# Patient Record
Sex: Male | Born: 2005 | Race: White | Hispanic: No | Marital: Single
Health system: Southern US, Community
[De-identification: ages and names within clinical notes are randomized; demographics above are authoritative.]

---
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
## 2024-07-11 ENCOUNTER — Emergency Department (HOSPITAL_BASED_OUTPATIENT_CLINIC_OR_DEPARTMENT_OTHER): Payer: Self-pay

## 2024-07-11 ENCOUNTER — Other Ambulatory Visit: Payer: Self-pay

## 2024-07-11 ENCOUNTER — Emergency Department (HOSPITAL_BASED_OUTPATIENT_CLINIC_OR_DEPARTMENT_OTHER)
Admission: EM | Admit: 2024-07-11 | Discharge: 2024-07-11 | Disposition: A | Discharge status: Home / Self Care | Payer: Self-pay

## 2024-07-11 ENCOUNTER — Encounter (HOSPITAL_BASED_OUTPATIENT_CLINIC_OR_DEPARTMENT_OTHER): Payer: Self-pay

## 2024-07-11 DIAGNOSIS — X501XXA Overexertion from prolonged static or awkward postures, initial encounter: Secondary | ICD-10-CM | POA: Insufficient documentation

## 2024-07-11 DIAGNOSIS — S4991XA Unspecified injury of right shoulder and upper arm, initial encounter: Secondary | ICD-10-CM | POA: Insufficient documentation

## 2024-07-11 MED ORDER — METHOCARBAMOL 500 MG PO TABS: 500.0000 mg | ORAL_TABLET | Freq: Two times a day (BID) | ORAL | 0 refills | Status: AC

## 2024-07-11 MED ORDER — LIDOCAINE 5 % EX PTCH: 1.0000 | MEDICATED_PATCH | CUTANEOUS | 0 refills | Status: AC

## 2024-07-11 MED ORDER — NAPROXEN 500 MG PO TABS: 500.0000 mg | ORAL_TABLET | Freq: Two times a day (BID) | ORAL | 0 refills | Status: AC

## 2024-07-11 NOTE — Discharge Instructions (Addendum)
 It was a pleasure taking care of you here today.  You have given you a sling.  Take medications as prescribed  Make sure to follow-up with orthopedics if her symptoms do not improve

## 2024-07-11 NOTE — ED Provider Notes (Signed)
 [place] EMERGENCY DEPARTMENT AT [facility] Provider Note   CSN: [id number] Arrival date & time: 07/11/24  1844     Patient presents with: Shoulder Injury   Devon Hopkins is a 18 y.o. male here for evaluation of right shoulder pain.  States he dislocated his shoulder a few days ago.  Was seen at [facility] after it had subsequently reduced by itself due to persistent pain.  He had an x-ray which was negative.  He comes in today as he continues to have pain, specifically with decreased range of motion and feeling like his shoulder is popping after he moves it.  Will occasionally have pain which radiates down his entire arm.  No numbness, weakness, fevers.  No redness or warmth.  No swelling to arms.  He was not given a sling or anything after his shoulder reduction.  Not followed with orthopedics.  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   HPI     Prior to Admission medications   Medication Sig Start Date End Date Taking? Authorizing Provider  lidocaine (LIDODERM) 5 % Place 1 patch onto the skin daily. Remove & Discard patch within 12 hours or as directed by MD 07/11/24  Yes Huyen Perazzo A, PA-C  methocarbamol (ROBAXIN) 500 MG tablet Take 1 tablet (500 mg total) by mouth 2 (two) times daily. 07/11/24  Yes Taraoluwa Thakur A, PA-C  naproxen (NAPROSYN) 500 MG tablet Take 1 tablet (500 mg total) by mouth 2 (two) times daily. 07/11/24  Yes Caylee Vlachos A, PA-C  DiphenhydrAMINE HCl (BENADRYL ALLERGY PO) Take 5 mLs by mouth daily as needed. Patient was given this medication for allergic reaction.    [provider]    Allergies: Patient has no known allergies.    Review of Systems  Constitutional: Negative.   HENT: Negative.    Respiratory: Negative.    Cardiovascular: Negative.   Gastrointestinal: Negative.   Genitourinary: Negative.   Musculoskeletal:        Right shoulder pain  Skin: Negative.   Neurological: Negative.   All other systems  reviewed and are negative.   Updated Vital Signs BP 114/69 (BP Location: Left Arm)   Pulse 60   Temp 98.4 F (36.9 C) (Oral)   Resp 18   Ht 6' 1 (1.854 m)   Wt 63.5 kg   SpO2 100%   BMI 18.47 kg/m   Physical Exam Vitals and nursing note reviewed.  Constitutional:      General: He is not in acute distress.    Appearance: He is well-developed. He is not ill-appearing, toxic-appearing or diaphoretic.  HENT:     Head: Normocephalic and atraumatic.  Eyes:     Pupils: Pupils are equal, round, and reactive to light.  Cardiovascular:     Rate and Rhythm: Normal rate and regular rhythm.     Pulses:          Radial pulses are 2+ on the right side and 2+ on the left side.  Pulmonary:     Effort: Pulmonary effort is normal. No respiratory distress.  Abdominal:     General: There is no distension.     Palpations: Abdomen is soft.  Musculoskeletal:        General: Normal range of motion.     Cervical back: Normal range of motion and neck supple.     Comments: Diffuse tenderness to right anterior shoulder. Full Passive ROM however pain w/ overhead movement above 45. Neg empty can. Non  tender in shaft/distal femur, forearm, hand.  Compartment soft.  Skin:    General: Skin is warm and dry.     Capillary Refill: Capillary refill takes less than 2 seconds.     Comments: No edema, erythema, warmth.  No pallor.  Neurological:     General: No focal deficit present.     Mental Status: He is alert and oriented to person, place, and time.     Cranial Nerves: Cranial nerves 2-12 are intact.     Sensory: Sensation is intact.     Motor: Motor function is intact.     Gait: Gait is intact.    (all labs ordered are listed, but only abnormal results are displayed) Labs Reviewed - No data to display  EKG: None  Radiology: CT Shoulder Right Wo Contrast Result Date: 07/11/2024 EXAM: CT RIGHT SHOULDER, WITHOUT IV CONTRAST 07/11/2024 08:49:31 PM TECHNIQUE: Axial images were acquired through  the right shoulder without IV contrast. Reformatted images were reviewed. Automated exposure control, iterative reconstruction, and/or weight based adjustment of the mA/kV was utilized to reduce the radiation dose to as low as reasonably achievable. COMPARISON: None available. CLINICAL HISTORY: Shoulder pain, stress fracture suspected, neg xray; xray at [facility] neg. 18 y/o male reports dislocating right shoulder on Thursday and popped shoulder back into place. Pt reports numbness/shooting pain down arm and into hand. Patient was seen at [facility] a couple days ago and had a normal XR. FINDINGS: BONES: No acute fracture or focal osseous lesion. JOINTS: No dislocation. The joint spaces are normal. SOFT TISSUES: The soft tissues are unremarkable. IMPRESSION: 1. No acute osseous abnormality. Electronically signed by: Norman Gatlin MD 07/11/2024 08:59 PM EDT RP Workstation: [id number]     .Ortho Injury Treatment  Date/Time: 07/11/2024 9:35 PM  Performed by: Edie Rosebud LABOR, PA-C Authorized by: Edie Rosebud LABOR, PA-C   Consent:    Consent obtained:  Verbal   Consent given by:  Patient   Risks discussed:  Fracture, nerve damage, restricted joint movement, vascular damage, stiffness, recurrent dislocation and irreducible dislocation   Alternatives discussed:  No treatment, alternative treatment, immobilization, referral and delayed treatmentInjury location: shoulder Location details: right shoulder Injury type: soft tissue Pre-procedure neurovascular assessment: neurovascularly intact Pre-procedure distal perfusion: normal Pre-procedure neurological function: normal Pre-procedure range of motion: normal  Anesthesia: Local anesthesia used: no  Patient sedated: NoImmobilization: brace Splint Applied by: ED Tech Post-procedure neurovascular assessment: post-procedure neurovascularly intact Post-procedure distal perfusion: normal Post-procedure neurological function: normal Post-procedure range of  motion: normal      Medications Ordered in the ED - No data to display  18 year old here for evaluation of persistent right shoulder pain, popping sensation decreased range of motion after dislocating right shoulder with spontaneous reduction.  Initially was seen at OSH had negative x-ray of his shoulder.  He continues to have pain.  He was not given a sling.  Here he is neurovascularly intact.  His compartments are soft.  Low suspicion for VTE or ischemia.  He has decreased passive range of motion due to pain.  No erythema, warmth, low suspicion for septic joint.  Compartments soft.  Neurovascularly intact.  Imaging personally viewed interpreted CT right shoulder without evidence of occult fracture, effusion, dislocation  Discussed results with patient.  He was placed in a sling.  Discussed NSAIDs, muscle laxer's, lidocaine patches, close up with orthopedics.  He will return for any worsening symptoms.  The patient has been appropriately medically screened and/or stabilized in the ED. I have low suspicion  for any other emergent medical condition which would require further screening, evaluation or treatment in the ED or require inpatient management.  Patient is hemodynamically stable and in no acute distress.  Patient able to ambulate in department prior to ED.  Evaluation does not show acute pathology that would require ongoing or additional emergent interventions while in the emergency department or further inpatient treatment.  I have discussed the diagnosis with the patient and answered all questions.  Pain is been managed while in the emergency department and patient has no further complaints prior to discharge.  Patient is comfortable with plan discussed in room and is stable for discharge at this time.  I have discussed strict return precautions for returning to the emergency department.  Patient was encouraged to follow-up with PCP/specialist refer to at discharge.                                    Medical Decision Making Amount and/or Complexity of Data Reviewed External Data Reviewed: labs, radiology and notes. Radiology: ordered and independent interpretation performed. Decision-making details documented in ED Course.  Risk OTC drugs. Prescription drug management. Decision regarding hospitalization. Diagnosis or treatment significantly limited by social determinants of health.        Final diagnoses:  Injury of right shoulder, initial encounter    ED Discharge Orders          Ordered    naproxen (NAPROSYN) 500 MG tablet  2 times daily        07/11/24 2134    lidocaine (LIDODERM) 5 %  Every 24 hours        07/11/24 2134    methocarbamol (ROBAXIN) 500 MG tablet  2 times daily        07/11/24 2134               Sheilla Maris A, PA-C 07/11/24 2136    Simon Lavonia SAILOR, MD 07/11/24 2357

## 2024-07-11 NOTE — ED Triage Notes (Signed)
 Pt reports dislocating right shoulder on Thursday and popped shoulder back into place. Pt reports numbness/shooting pain down arm and into hand. Seen at [facility] a couple days ago and hand xray done. PT states xray was normal and he was discharged but that he's still having the shooting pains. Peripheral pulse intact.
